# Patient Record
Sex: Female | Born: 1960 | Race: White | Hispanic: No | Smoking: Current every day smoker
Health system: Southern US, Community
[De-identification: ages and names within clinical notes are randomized; demographics above are authoritative.]

## PROBLEM LIST (undated history)

## (undated) DIAGNOSIS — I10 Essential (primary) hypertension: Secondary | ICD-10-CM

## (undated) DIAGNOSIS — M1990 Unspecified osteoarthritis, unspecified site: Secondary | ICD-10-CM

## (undated) DIAGNOSIS — G709 Myoneural disorder, unspecified: Secondary | ICD-10-CM

## (undated) DIAGNOSIS — T4145XA Adverse effect of unspecified anesthetic, initial encounter: Secondary | ICD-10-CM

## (undated) DIAGNOSIS — R0602 Shortness of breath: Secondary | ICD-10-CM

## (undated) DIAGNOSIS — Z8719 Personal history of other diseases of the digestive system: Secondary | ICD-10-CM

## (undated) DIAGNOSIS — K219 Gastro-esophageal reflux disease without esophagitis: Secondary | ICD-10-CM

## (undated) DIAGNOSIS — T8859XA Other complications of anesthesia, initial encounter: Secondary | ICD-10-CM

## (undated) DIAGNOSIS — I499 Cardiac arrhythmia, unspecified: Secondary | ICD-10-CM

## (undated) DIAGNOSIS — Z87898 Personal history of other specified conditions: Secondary | ICD-10-CM

## (undated) HISTORY — PX: ECTOPIC PREGNANCY SURGERY: SHX613

## (undated) HISTORY — PX: HERNIA REPAIR: SHX51

## (undated) HISTORY — PX: FACIAL FRACTURE SURGERY: SHX1570

## (undated) HISTORY — PX: BACK SURGERY: SHX140

## (undated) HISTORY — PX: ABDOMINAL EXPLORATION SURGERY: SHX538

## (undated) HISTORY — PX: CARDIAC CATHETERIZATION: SHX172

---

## 2013-02-17 ENCOUNTER — Other Ambulatory Visit: Payer: Self-pay | Admitting: Neurological Surgery

## 2013-03-07 ENCOUNTER — Encounter (HOSPITAL_COMMUNITY): Payer: Self-pay | Admitting: Pharmacy Technician

## 2013-03-10 ENCOUNTER — Ambulatory Visit (HOSPITAL_COMMUNITY)
Admission: RE | Admit: 2013-03-10 | Discharge: 2013-03-10 | Disposition: A | Discharge status: Home / Self Care | Payer: BC Managed Care – PPO | Source: Ambulatory Visit | Attending: Neurological Surgery | Admitting: Neurological Surgery

## 2013-03-10 ENCOUNTER — Encounter (HOSPITAL_COMMUNITY): Payer: Self-pay

## 2013-03-10 ENCOUNTER — Encounter (HOSPITAL_COMMUNITY)
Admission: RE | Admit: 2013-03-10 | Discharge: 2013-03-10 | Disposition: A | Discharge status: Home / Self Care | Payer: BC Managed Care – PPO | Source: Ambulatory Visit | Attending: Neurological Surgery | Admitting: Neurological Surgery

## 2013-03-10 DIAGNOSIS — Z01812 Encounter for preprocedural laboratory examination: Secondary | ICD-10-CM | POA: Insufficient documentation

## 2013-03-10 DIAGNOSIS — Z01818 Encounter for other preprocedural examination: Secondary | ICD-10-CM | POA: Insufficient documentation

## 2013-03-10 DIAGNOSIS — Z01810 Encounter for preprocedural cardiovascular examination: Secondary | ICD-10-CM | POA: Insufficient documentation

## 2013-03-10 HISTORY — DX: Adverse effect of unspecified anesthetic, initial encounter: T41.45XA

## 2013-03-10 HISTORY — DX: Personal history of other specified conditions: Z87.898

## 2013-03-10 HISTORY — DX: Cardiac arrhythmia, unspecified: I49.9

## 2013-03-10 HISTORY — DX: Personal history of other diseases of the digestive system: Z87.19

## 2013-03-10 HISTORY — DX: Other complications of anesthesia, initial encounter: T88.59XA

## 2013-03-10 HISTORY — DX: Shortness of breath: R06.02

## 2013-03-10 HISTORY — DX: Myoneural disorder, unspecified: G70.9

## 2013-03-10 HISTORY — DX: Gastro-esophageal reflux disease without esophagitis: K21.9

## 2013-03-10 HISTORY — DX: Unspecified osteoarthritis, unspecified site: M19.90

## 2013-03-10 HISTORY — DX: Essential (primary) hypertension: I10

## 2013-03-10 LAB — CBC
HCT: 43.2 % (ref 36.0–46.0)
Hemoglobin: 14.8 g/dL (ref 12.0–15.0)
MCH: 32.5 pg (ref 26.0–34.0)
MCHC: 34.3 g/dL (ref 30.0–36.0)
RDW: 13.6 % (ref 11.5–15.5)

## 2013-03-10 LAB — BASIC METABOLIC PANEL
BUN: 11 mg/dL (ref 6–23)
Calcium: 9.6 mg/dL (ref 8.4–10.5)
Creatinine, Ser: 0.67 mg/dL (ref 0.50–1.10)
GFR calc Af Amer: >90 mL/min (ref >90)
GFR calc non Af Amer: >90 mL/min (ref >90)
Glucose, Bld: 120 mg/dL — ABNORMAL HIGH (ref 70–99)

## 2013-03-10 LAB — ABO/RH: ABO/RH(D): A POS

## 2013-03-10 NOTE — Pre-Procedure Instructions (Signed)
SHAILYN WEYANDT  03/10/2013   Your procedure is scheduled on:  Wednesday  03/19/13    Report to [facility] at 530 AM.  Call this number if you have problems the morning of surgery: [phone]   Remember:   Do not eat food or drink liquids after midnight.   Take these medicines the morning of surgery with A SIP OF WATER: XANAX, AMLODIPINE(NORVASC), METOPROLOL(LOPRESSOR)    Do not wear jewelry, make-up or nail polish.  Do not wear lotions, powders, or perfumes. You may wear deodorant.  Do not shave 48 hours prior to surgery. Men may shave face and neck.  Do not bring valuables to the hospital.  [facility] is not responsible                   for any belongings or valuables.  Contacts, dentures or bridgework may not be worn into surgery.  Leave suitcase in the car. After surgery it may be brought to your room.  For patients admitted to the hospital, checkout time is 11:00 AM the day of  discharge.   Patients discharged the day of surgery will not be allowed to drive  home.  Name and phone number of your driver:   Special Instructions: Shower using CHG 2 nights before surgery and the night before surgery.  If you shower the day of surgery use CHG.  Use special wash - you have one bottle of CHG for all showers.  You should use approximately 1/3 of the bottle for each shower.   Please read over the following fact sheets that you were given: Pain Booklet, Coughing and Deep Breathing, Blood Transfusion Information, MRSA Information and Surgical Site Infection Prevention

## 2013-03-10 NOTE — Progress Notes (Addendum)
Anesthesia Chart Review:  Patient is a 52 year old female scheduled for L4-5 decompression, PLIF on 03/18/13 by Dr. Danielle Dess.  History includes smoking, hypertension, dysrhythmia (not specified), chronic dyspnea on exertion, GERD, hiatal hernia, paresthesia involving the fingers/toes, arthritis, back surgery 2011, history of facial fracture surgery, hernia repair 2012. OSA screening score was 4.  PCP is Dr. Christena Flake.  Cardiologist is Dr. Henriette Combs with [facility] who cleared patient for this procedure.  She had a nuclear stress test on 11/26/12 that showed normal perfusion, global hypokinesis with EF of 38%, no transient ischemic dilatation noted.  Due to her chest pain symptoms and reduced EF she was referred to cardiology and seen by Dr. Henriette Combs who recommended a cardiac cath (see below).  Cardiac cath on 12/19/12 at [facility] showed mild non-obstructive CAD (mild disease LAD, mild luminal irregularities of DIAG1/2, LCx, OM1/2/3, right PDA, first right PLA, 30% mid RCA stenosis), elevated opening aorta pressure consistent with hypertensive heart disease, mildly elevated LV filling pressures with LVEDP of 16 mm Hg, no gradient across the AV, hyperdynamic LV systolic function with LVEF of 76%, no regional wall motion abnormalities, no MR.  Notes indicates that she had an echo in 11/2012, but records are still pending.  EKG on 03/10/13 showed SB at 38 bpm, anterior ST/T wave abnormality, consider anterior ischemia. Her T wave abnormality appears similar to her EKG from 12/19/12.  (Her HR was 43 bpm at PAT and 47 bpm on 12/19/12 at [facility]).  She is on metoprolol 50 mg daily.  I was not asked to evaluate patient during her PAT visit.  Her cardiology office is now closed, but will contact their office tomorrow regarding bradycardia in case any medication adjustments are felt warranted prior to surgery.      CXR report on 03/10/13 showed: Heart size is normal.  Emphysematous changes are present. Mild bibasilar opacities likely represents scarring or atelectasis. No significant airspace consolidation is present. S-shaped scoliosis of the lumbar spine is convex to the right at T7-8 into the left at L1.  Preoperative labs noted.    I will follow-up with [facility] regarding EKG and echocardiogram tomorrow.  Velna Ochs [facility]/Anesthesiology Phone [phone] 03/10/2013 5:06 PM  Addendum: 03/11/13 3:00 PM I called and spoke with patient.  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  She has been on metoprolol 50 mg Q AM (instead of BID) because of bradycardia. She gets occasional feeling of light headedness that only lasts a few seconds, but otherwise does not feel very symptomatic.  I reviewed with Anesthesiologist Dr. Chaney Malling.  Patient is a first case, so we will have her hold metoprolol on the morning of surgery--at least until we are able to get vitals.  She can be given metoprolol PO or IV at that time if there is no significant bradycardia.  Patient is aware.  I also spoke with Becky at Dr. Magnus Ivan office about patient's bradycardia and will fax a copy of her EKG from 03/10/13.  She will have one the the cardiologists review, and their office will let the patient know if they have any additional recommendations. Echo report is still pending.  Addendum: 03/12/13 12:15 PM I received a phone call from nurse Lawanda Cousins at [facility].  She reviewed 03/10/13 EKG with covering physician Dr. Iva Lento.  He would like patient to stop her metoprolol for now.  They will also see if she can  go by their [place] office for a repeat EKG on 03/17/13.  Dr. Eliberto Ivory will be back in the office by then, and she will have him review. Becky plans to fax a copy of the EKG to the Short Stay PAT fax [phone]).  I asked to to let Elsner's office know if there are any changes in Dr. Magnus Ivan recommendations.  She will update  the patient.  I also updated Jessica at Dr. Verlee Rossetti office. [facility] reports they do not have an echo report to fax.

## 2013-03-10 NOTE — Progress Notes (Signed)
03/10/13 1046  OBSTRUCTIVE SLEEP APNEA  Have you ever been diagnosed with sleep apnea through a sleep study? No  Do you snore loudly (loud enough to be heard through closed doors)?  1  Do you often feel tired, fatigued, or sleepy during the daytime? 1  Has anyone observed you stop breathing during your sleep? 0  Do you have, or are you being treated for high blood pressure? 1  BMI more than 35 kg/m2? 0  Age over 53 years old? 1  Neck circumference greater than 40 cm/18 inches? 0  Gender: 0  Obstructive Sleep Apnea Score 4  Score 4 or greater  Results sent to PCP

## 2013-03-11 ENCOUNTER — Encounter (HOSPITAL_COMMUNITY): Payer: Self-pay

## 2013-03-17 MED ORDER — CEFAZOLIN SODIUM-DEXTROSE 2-3 GM-% IV SOLR
2.0000 g | INTRAVENOUS | Status: AC
  Administered 2013-03-18: 2 g via INTRAVENOUS
  Filled 2013-03-17: qty 50

## 2013-03-18 ENCOUNTER — Encounter (HOSPITAL_COMMUNITY): Payer: Self-pay | Admitting: Vascular Surgery

## 2013-03-18 ENCOUNTER — Inpatient Hospital Stay (HOSPITAL_COMMUNITY)
Admission: RE | Admit: 2013-03-18 | Discharge: 2013-03-21 | DRG: 756 | Disposition: A | Discharge status: Home / Self Care | Payer: BC Managed Care – PPO | Source: Ambulatory Visit | Attending: Neurological Surgery | Admitting: Neurological Surgery

## 2013-03-18 ENCOUNTER — Encounter (HOSPITAL_COMMUNITY): Payer: Self-pay | Admitting: *Deleted

## 2013-03-18 ENCOUNTER — Inpatient Hospital Stay (HOSPITAL_COMMUNITY): Payer: BC Managed Care – PPO

## 2013-03-18 ENCOUNTER — Inpatient Hospital Stay (HOSPITAL_COMMUNITY): Payer: BC Managed Care – PPO | Admitting: Anesthesiology

## 2013-03-18 ENCOUNTER — Encounter (HOSPITAL_COMMUNITY)
Admission: RE | Disposition: A | Discharge status: Home / Self Care | Payer: Self-pay | Source: Ambulatory Visit | Attending: Neurological Surgery

## 2013-03-18 DIAGNOSIS — M4316 Spondylolisthesis, lumbar region: Secondary | ICD-10-CM

## 2013-03-18 DIAGNOSIS — F17200 Nicotine dependence, unspecified, uncomplicated: Secondary | ICD-10-CM | POA: Diagnosis present

## 2013-03-18 DIAGNOSIS — Q675 Congenital deformity of spine: Secondary | ICD-10-CM

## 2013-03-18 DIAGNOSIS — I10 Essential (primary) hypertension: Secondary | ICD-10-CM | POA: Diagnosis present

## 2013-03-18 DIAGNOSIS — K219 Gastro-esophageal reflux disease without esophagitis: Secondary | ICD-10-CM | POA: Diagnosis present

## 2013-03-18 DIAGNOSIS — M48061 Spinal stenosis, lumbar region without neurogenic claudication: Principal | ICD-10-CM | POA: Diagnosis present

## 2013-03-18 DIAGNOSIS — M4180 Other forms of scoliosis, site unspecified: Secondary | ICD-10-CM | POA: Diagnosis present

## 2013-03-18 SURGERY — POSTERIOR LUMBAR FUSION 1 LEVEL
Anesthesia: General | Site: Back | Wound class: Clean

## 2013-03-18 MED ORDER — FENTANYL CITRATE 0.05 MG/ML IJ SOLN
INTRAMUSCULAR | Status: DC | PRN
  Administered 2013-03-18: 100 ug via INTRAVENOUS
  Administered 2013-03-18: 50 ug via INTRAVENOUS
  Administered 2013-03-18: 100 ug via INTRAVENOUS
  Administered 2013-03-18: 50 ug via INTRAVENOUS

## 2013-03-18 MED ORDER — SODIUM CHLORIDE 0.9 % IV SOLN
INTRAVENOUS | Status: DC
  Administered 2013-03-18 – 2013-03-19 (×2): via INTRAVENOUS

## 2013-03-18 MED ORDER — OXYCODONE HCL 5 MG PO TABS
5.0000 mg | ORAL_TABLET | Freq: Once | ORAL | Status: AC | PRN
  Administered 2013-03-18: 5 mg via ORAL

## 2013-03-18 MED ORDER — SODIUM CHLORIDE 0.9 % IJ SOLN
3.0000 mL | Freq: Two times a day (BID) | INTRAMUSCULAR | Status: DC
  Administered 2013-03-20: 3 mL via INTRAVENOUS

## 2013-03-18 MED ORDER — ARTIFICIAL TEARS OP OINT
TOPICAL_OINTMENT | OPHTHALMIC | Status: DC | PRN
  Administered 2013-03-18: 1 via OPHTHALMIC

## 2013-03-18 MED ORDER — BUPIVACAINE HCL (PF) 0.5 % IJ SOLN
INTRAMUSCULAR | Status: DC | PRN
  Administered 2013-03-18: 10 mL

## 2013-03-18 MED ORDER — POLYETHYLENE GLYCOL 3350 17 G PO PACK
17.0000 g | PACK | Freq: Every day | ORAL | Status: DC | PRN
  Filled 2013-03-18: qty 1

## 2013-03-18 MED ORDER — 0.9 % SODIUM CHLORIDE (POUR BTL) OPTIME
TOPICAL | Status: DC | PRN
  Administered 2013-03-18: 1000 mL

## 2013-03-18 MED ORDER — EPHEDRINE SULFATE 50 MG/ML IJ SOLN
INTRAMUSCULAR | Status: DC | PRN
  Administered 2013-03-18: 10 mg via INTRAVENOUS

## 2013-03-18 MED ORDER — SODIUM CHLORIDE 0.9 % IR SOLN
Status: DC | PRN
  Administered 2013-03-18: 11:00:00

## 2013-03-18 MED ORDER — PHENYLEPHRINE HCL 10 MG/ML IJ SOLN
INTRAMUSCULAR | Status: DC | PRN
  Administered 2013-03-18 (×2): 80 ug via INTRAVENOUS

## 2013-03-18 MED ORDER — FLEET ENEMA 7-19 GM/118ML RE ENEM: 1.0000 | ENEMA | Freq: Once | RECTAL | Status: AC | PRN

## 2013-03-18 MED ORDER — ALUM & MAG HYDROXIDE-SIMETH 200-200-20 MG/5ML PO SUSP: 30.0000 mL | Freq: Four times a day (QID) | ORAL | Status: DC | PRN

## 2013-03-18 MED ORDER — SODIUM CHLORIDE 0.9 % IV SOLN
INTRAVENOUS | Status: AC
  Filled 2013-03-18: qty 500

## 2013-03-18 MED ORDER — MENTHOL 3 MG MT LOZG: 1.0000 | LOZENGE | OROMUCOSAL | Status: DC | PRN

## 2013-03-18 MED ORDER — LIDOCAINE HCL (CARDIAC) 20 MG/ML IV SOLN
INTRAVENOUS | Status: DC | PRN
  Administered 2013-03-18: 100 mg via INTRAVENOUS

## 2013-03-18 MED ORDER — ACETAMINOPHEN 325 MG PO TABS
650.0000 mg | ORAL_TABLET | ORAL | Status: DC | PRN
  Administered 2013-03-19 – 2013-03-20 (×4): 650 mg via ORAL
  Filled 2013-03-18 (×4): qty 2

## 2013-03-18 MED ORDER — MORPHINE SULFATE 2 MG/ML IJ SOLN
1.0000 mg | INTRAMUSCULAR | Status: DC | PRN
  Administered 2013-03-18 – 2013-03-20 (×10): 2 mg via INTRAVENOUS
  Filled 2013-03-18 (×11): qty 1

## 2013-03-18 MED ORDER — LACTATED RINGERS IV SOLN
INTRAVENOUS | Status: DC | PRN
  Administered 2013-03-18 (×2): via INTRAVENOUS

## 2013-03-18 MED ORDER — OXYCODONE HCL 5 MG PO TABS
ORAL_TABLET | ORAL | Status: AC
  Filled 2013-03-18: qty 1

## 2013-03-18 MED ORDER — METOPROLOL TARTRATE 50 MG PO TABS
50.0000 mg | ORAL_TABLET | Freq: Every morning | ORAL | Status: DC
  Administered 2013-03-21: 50 mg via ORAL
  Filled 2013-03-18 (×3): qty 1

## 2013-03-18 MED ORDER — ONDANSETRON HCL 4 MG/2ML IJ SOLN
4.0000 mg | INTRAMUSCULAR | Status: DC | PRN
  Administered 2013-03-18 – 2013-03-19 (×2): 4 mg via INTRAVENOUS
  Filled 2013-03-18 (×2): qty 2

## 2013-03-18 MED ORDER — AMLODIPINE BESYLATE 5 MG PO TABS
5.0000 mg | ORAL_TABLET | Freq: Every day | ORAL | Status: DC
  Administered 2013-03-19 – 2013-03-21 (×3): 5 mg via ORAL
  Filled 2013-03-18 (×3): qty 1

## 2013-03-18 MED ORDER — OXYCODONE HCL 5 MG/5ML PO SOLN: 5.0000 mg | Freq: Once | ORAL | Status: AC | PRN

## 2013-03-18 MED ORDER — SODIUM CHLORIDE 0.9 % IV SOLN: 250.0000 mL | INTRAVENOUS | Status: DC

## 2013-03-18 MED ORDER — BISACODYL 10 MG RE SUPP: 10.0000 mg | Freq: Every day | RECTAL | Status: DC | PRN

## 2013-03-18 MED ORDER — HYDROMORPHONE HCL PF 1 MG/ML IJ SOLN
INTRAMUSCULAR | Status: AC
  Filled 2013-03-18: qty 1

## 2013-03-18 MED ORDER — ONDANSETRON HCL 4 MG/2ML IJ SOLN
INTRAMUSCULAR | Status: DC | PRN
  Administered 2013-03-18: 4 mg via INTRAVENOUS

## 2013-03-18 MED ORDER — METHOCARBAMOL 100 MG/ML IJ SOLN
500.0000 mg | Freq: Four times a day (QID) | INTRAVENOUS | Status: DC | PRN
  Filled 2013-03-18 (×2): qty 5

## 2013-03-18 MED ORDER — BACITRACIN 50000 UNITS IM SOLR
INTRAMUSCULAR | Status: AC
  Filled 2013-03-18: qty 1

## 2013-03-18 MED ORDER — MIDAZOLAM HCL 5 MG/5ML IJ SOLN
INTRAMUSCULAR | Status: DC | PRN
  Administered 2013-03-18: 2 mg via INTRAVENOUS

## 2013-03-18 MED ORDER — LISINOPRIL 40 MG PO TABS
40.0000 mg | ORAL_TABLET | Freq: Every day | ORAL | Status: DC
  Administered 2013-03-19 – 2013-03-21 (×3): 40 mg via ORAL
  Filled 2013-03-18 (×3): qty 1

## 2013-03-18 MED ORDER — SENNA 8.6 MG PO TABS
1.0000 | ORAL_TABLET | Freq: Two times a day (BID) | ORAL | Status: DC
  Administered 2013-03-18 – 2013-03-20 (×5): 8.6 mg via ORAL
  Filled 2013-03-18 (×9): qty 1

## 2013-03-18 MED ORDER — ALPRAZOLAM 0.25 MG PO TABS
0.2500 mg | ORAL_TABLET | Freq: Three times a day (TID) | ORAL | Status: DC | PRN
  Administered 2013-03-20 (×2): 0.25 mg via ORAL
  Filled 2013-03-18 (×2): qty 1

## 2013-03-18 MED ORDER — ACETAMINOPHEN 650 MG RE SUPP: 650.0000 mg | RECTAL | Status: DC | PRN

## 2013-03-18 MED ORDER — METHOCARBAMOL 500 MG PO TABS
500.0000 mg | ORAL_TABLET | Freq: Four times a day (QID) | ORAL | Status: DC | PRN
  Administered 2013-03-19: 500 mg via ORAL
  Filled 2013-03-18 (×2): qty 1

## 2013-03-18 MED ORDER — ROCURONIUM BROMIDE 100 MG/10ML IV SOLN
INTRAVENOUS | Status: DC | PRN
  Administered 2013-03-18: 50 mg via INTRAVENOUS

## 2013-03-18 MED ORDER — SODIUM CHLORIDE 0.9 % IV SOLN
10.0000 mg | INTRAVENOUS | Status: DC | PRN
  Administered 2013-03-18: 15 ug/min via INTRAVENOUS

## 2013-03-18 MED ORDER — HEMOSTATIC AGENTS (NO CHARGE) OPTIME
TOPICAL | Status: DC | PRN
  Administered 2013-03-18: 1 via TOPICAL

## 2013-03-18 MED ORDER — NEOSTIGMINE METHYLSULFATE 1 MG/ML IJ SOLN
INTRAMUSCULAR | Status: DC | PRN
  Administered 2013-03-18: 2 mg via INTRAVENOUS

## 2013-03-18 MED ORDER — DOCUSATE SODIUM 100 MG PO CAPS
100.0000 mg | ORAL_CAPSULE | Freq: Two times a day (BID) | ORAL | Status: DC
  Administered 2013-03-18 – 2013-03-20 (×5): 100 mg via ORAL
  Filled 2013-03-18 (×4): qty 1

## 2013-03-18 MED ORDER — PROPOFOL 10 MG/ML IV BOLUS
INTRAVENOUS | Status: DC | PRN
  Administered 2013-03-18: 200 mg via INTRAVENOUS

## 2013-03-18 MED ORDER — OXYCODONE-ACETAMINOPHEN 5-325 MG PO TABS
1.0000 | ORAL_TABLET | ORAL | Status: DC | PRN
  Administered 2013-03-18 – 2013-03-21 (×4): 2 via ORAL
  Filled 2013-03-18 (×4): qty 2

## 2013-03-18 MED ORDER — HYDROMORPHONE HCL PF 1 MG/ML IJ SOLN
0.2500 mg | INTRAMUSCULAR | Status: DC | PRN
  Administered 2013-03-18 (×2): 0.5 mg via INTRAVENOUS

## 2013-03-18 MED ORDER — CEFAZOLIN SODIUM 1-5 GM-% IV SOLN
1.0000 g | Freq: Three times a day (TID) | INTRAVENOUS | Status: AC
  Administered 2013-03-18 – 2013-03-19 (×2): 1 g via INTRAVENOUS
  Filled 2013-03-18 (×2): qty 50

## 2013-03-18 MED ORDER — SODIUM CHLORIDE 0.9 % IJ SOLN: 3.0000 mL | INTRAMUSCULAR | Status: DC | PRN

## 2013-03-18 MED ORDER — THROMBIN 20000 UNITS EX SOLR
CUTANEOUS | Status: DC | PRN
  Administered 2013-03-18: 11:00:00 via TOPICAL

## 2013-03-18 MED ORDER — ONDANSETRON HCL 4 MG/2ML IJ SOLN: 4.0000 mg | Freq: Four times a day (QID) | INTRAMUSCULAR | Status: DC | PRN

## 2013-03-18 MED ORDER — LIDOCAINE-EPINEPHRINE 1 %-1:100000 IJ SOLN
INTRAMUSCULAR | Status: DC | PRN
  Administered 2013-03-18: 10 mL

## 2013-03-18 MED ORDER — GLYCOPYRROLATE 0.2 MG/ML IJ SOLN
INTRAMUSCULAR | Status: DC | PRN
  Administered 2013-03-18: 0.4 mg via INTRAVENOUS

## 2013-03-18 MED ORDER — PHENOL 1.4 % MT LIQD: 1.0000 | OROMUCOSAL | Status: DC | PRN

## 2013-03-18 SURGICAL SUPPLY — 63 items
BAG DECANTER FOR FLEXI CONT (MISCELLANEOUS) ×2 IMPLANT
BLADE SURG ROTATE 9660 (MISCELLANEOUS) IMPLANT
BUR MATCHSTICK NEURO 3.0 LAGG (BURR) ×2 IMPLANT
CAGE 11MM (Cage) ×2 IMPLANT
CANISTER SUCTION 2500CC (MISCELLANEOUS) ×2 IMPLANT
CLOTH BEACON ORANGE TIMEOUT ST (SAFETY) ×2 IMPLANT
CONT SPEC 4OZ CLIKSEAL STRL BL (MISCELLANEOUS) ×4 IMPLANT
COVER BACK TABLE 24X17X13 BIG (DRAPES) IMPLANT
COVER TABLE BACK 60X90 (DRAPES) ×2 IMPLANT
DECANTER SPIKE VIAL GLASS SM (MISCELLANEOUS) ×2 IMPLANT
DERMABOND ADVANCED (GAUZE/BANDAGES/DRESSINGS) ×1
DERMABOND ADVANCED .7 DNX12 (GAUZE/BANDAGES/DRESSINGS) ×1 IMPLANT
DRAPE C-ARM 42X72 X-RAY (DRAPES) ×4 IMPLANT
DRAPE LAPAROTOMY 100X72X124 (DRAPES) ×2 IMPLANT
DRAPE POUCH INSTRU U-SHP 10X18 (DRAPES) ×2 IMPLANT
DRAPE PROXIMA HALF (DRAPES) ×2 IMPLANT
DRSG OPSITE POSTOP 4X10 (GAUZE/BANDAGES/DRESSINGS) ×2 IMPLANT
DURAPREP 26ML APPLICATOR (WOUND CARE) ×2 IMPLANT
ELECT REM PT RETURN 9FT ADLT (ELECTROSURGICAL) ×2
ELECTRODE REM PT RTRN 9FT ADLT (ELECTROSURGICAL) ×1 IMPLANT
GAUZE SPONGE 4X4 16PLY XRAY LF (GAUZE/BANDAGES/DRESSINGS) IMPLANT
GLOVE BIOGEL M 8.0 STRL (GLOVE) ×2 IMPLANT
GLOVE BIOGEL PI IND STRL 8 (GLOVE) ×3 IMPLANT
GLOVE BIOGEL PI IND STRL 8.5 (GLOVE) ×2 IMPLANT
GLOVE BIOGEL PI INDICATOR 8 (GLOVE) ×3
GLOVE BIOGEL PI INDICATOR 8.5 (GLOVE) ×2
GLOVE ECLIPSE 7.5 STRL STRAW (GLOVE) ×6 IMPLANT
GLOVE ECLIPSE 8.5 STRL (GLOVE) ×4 IMPLANT
GLOVE EXAM NITRILE LRG STRL (GLOVE) IMPLANT
GLOVE EXAM NITRILE MD LF STRL (GLOVE) IMPLANT
GLOVE EXAM NITRILE XL STR (GLOVE) IMPLANT
GLOVE EXAM NITRILE XS STR PU (GLOVE) IMPLANT
GLOVE INDICATOR 8.0 STRL GRN (GLOVE) ×6 IMPLANT
GOWN BRE IMP SLV AUR LG STRL (GOWN DISPOSABLE) ×2 IMPLANT
GOWN BRE IMP SLV AUR XL STRL (GOWN DISPOSABLE) IMPLANT
GOWN STRL REIN 2XL LVL4 (GOWN DISPOSABLE) ×8 IMPLANT
KIT BASIN OR (CUSTOM PROCEDURE TRAY) ×2 IMPLANT
KIT ROOM TURNOVER OR (KITS) ×2 IMPLANT
MILL MEDIUM DISP (BLADE) ×2 IMPLANT
NEEDLE HYPO 22GX1.5 SAFETY (NEEDLE) ×2 IMPLANT
NS IRRIG 1000ML POUR BTL (IV SOLUTION) ×2 IMPLANT
PACK FOAM VITOSS 10CC (Orthopedic Implant) ×2 IMPLANT
PACK LAMINECTOMY NEURO (CUSTOM PROCEDURE TRAY) ×2 IMPLANT
PAD ARMBOARD 7.5X6 YLW CONV (MISCELLANEOUS) ×6 IMPLANT
PATTIES SURGICAL .5 X1 (DISPOSABLE) ×2 IMPLANT
ROD 35MM (Rod) ×4 IMPLANT
SCREW 40MM (Screw) ×8 IMPLANT
SCREW SET SPINAL STD HEXALOBE (Screw) ×8 IMPLANT
SPONGE GAUZE 4X4 12PLY (GAUZE/BANDAGES/DRESSINGS) ×2 IMPLANT
SPONGE LAP 4X18 X RAY DECT (DISPOSABLE) IMPLANT
SPONGE SURGIFOAM ABS GEL 100 (HEMOSTASIS) ×2 IMPLANT
SUT PROLENE 6 0 BV (SUTURE) ×2 IMPLANT
SUT VIC AB 1 CT1 18XBRD ANBCTR (SUTURE) ×2 IMPLANT
SUT VIC AB 1 CT1 8-18 (SUTURE) ×2
SUT VIC AB 2-0 CP2 18 (SUTURE) ×4 IMPLANT
SUT VIC AB 3-0 SH 8-18 (SUTURE) ×2 IMPLANT
SYR 20ML ECCENTRIC (SYRINGE) ×2 IMPLANT
SYR 3ML LL SCALE MARK (SYRINGE) ×8 IMPLANT
TOWEL OR 17X24 6PK STRL BLUE (TOWEL DISPOSABLE) ×2 IMPLANT
TOWEL OR 17X26 10 PK STRL BLUE (TOWEL DISPOSABLE) ×2 IMPLANT
TRAP SPECIMEN MUCOUS 40CC (MISCELLANEOUS) ×2 IMPLANT
TRAY FOLEY CATH 14FRSI W/METER (CATHETERS) ×2 IMPLANT
WATER STERILE IRR 1000ML POUR (IV SOLUTION) ×2 IMPLANT

## 2013-03-18 NOTE — Anesthesia Preprocedure Evaluation (Addendum)
Anesthesia Evaluation  Patient identified by MRN, date of birth, ID band Patient awake    Reviewed: Allergy & Precautions, H&P , NPO status , Patient's Chart, lab work & pertinent test results, reviewed documented beta blocker date and time   Airway Mallampati: I TM Distance: >3 FB Neck ROM: full    Dental  (+) Edentulous Upper, Upper Dentures and Dental Advidsory Given   Pulmonary shortness of breath and with exertion, Current Smoker,          Cardiovascular hypertension, On Home Beta Blockers - dysrhythmias     Neuro/Psych Anxiety  Neuromuscular disease    GI/Hepatic GERD-  Controlled and Medicated,  Endo/Other    Renal/GU      Musculoskeletal   Abdominal   Peds  Hematology   Anesthesia Other Findings   Reproductive/Obstetrics                          Anesthesia Physical Anesthesia Plan  ASA: II  Anesthesia Plan: General   Post-op Pain Management:    Induction: Intravenous  Airway Management Planned: Oral ETT  Additional Equipment:   Intra-op Plan:   Post-operative Plan: Extubation in OR  Informed Consent: I have reviewed the patients History and Physical, chart, labs and discussed the procedure including the risks, benefits and alternatives for the proposed anesthesia with the patient or authorized representative who has indicated his/her understanding and acceptance.   Dental Advisory Given  Plan Discussed with: Anesthesiologist, CRNA and Surgeon  Anesthesia Plan Comments:        Anesthesia Quick Evaluation

## 2013-03-18 NOTE — Anesthesia Procedure Notes (Signed)
Procedure Name: Intubation Date/Time: 03/18/2013 10:37 AM Performed by: Carmela Rima Pre-anesthesia Checklist: Patient identified, Timeout performed, Emergency Drugs available, Suction available and Patient being monitored Patient Re-evaluated:Patient Re-evaluated prior to inductionOxygen Delivery Method: Circle system utilized Preoxygenation: Pre-oxygenation with 100% oxygen Intubation Type: IV induction Ventilation: Mask ventilation without difficulty Laryngoscope Size: Mac and 3 Grade View: Grade II Tube type: Oral Tube size: 7.5 mm Number of attempts: 1 Placement Confirmation: ETT inserted through vocal cords under direct vision,  positive ETCO2 and breath sounds checked- equal and bilateral Secured at: 21 cm Tube secured with: Tape Dental Injury: Teeth and Oropharynx as per pre-operative assessment

## 2013-03-18 NOTE — Preoperative (Signed)
Beta Blockers   Reason not to administer Beta Blockers:Not Applicable 

## 2013-03-18 NOTE — Progress Notes (Signed)
   CARE MANAGEMENT NOTE 03/18/2013  Patient:  Belinda Vazquez, Belinda Vazquez   Account Number:  [id number]  Date Initiated:  03/18/2013  Documentation initiated by:  Jiles Crocker  Subjective/Objective Assessment:   ADMITTED FOR SURGERY - L4-L5 decompressive laminectomy decompression of L4 and L5     Action/Plan:   LIVES WITH FAMILY MEMBERS; CM FOLLOWING FOR DCP   Anticipated DC Date:  03/25/2013   Anticipated DC Plan:  HOME/SELF CARE/ VS HHC      DC Planning Services  CM consult               Status of service:  In process, will continue to follow Medicare Important Message given?  NA - LOS <3 / Initial given by admissions (If response is "NO", the following Medicare IM given date fields will be blank)  Per UR Regulation:  Reviewed for med. necessity/level of care/duration of stay  If discussed at Long Length of Stay Meetings, dates discussed:    Comments:  03/18/2013- B Yuliya Nova RN,BSN,MHA

## 2013-03-18 NOTE — Transfer of Care (Signed)
Immediate Anesthesia Transfer of Care Note  Patient: Belinda Vazquez  Procedure(s) Performed: Procedure(s) with comments: POSTERIOR LUMBAR FUSION 1 LEVEL (N/A) - Lumbar four-five Decompression/posterior lumbar interbody fusion with peek spacers/pedicle screws  Patient Location: PACU  Anesthesia Type:General  Level of Consciousness: awake, alert  and oriented  Airway & Oxygen Therapy: Patient Spontanous Breathing and Patient connected to nasal cannula oxygen  Post-op Assessment: Report given to PACU RN, Post -op Vital signs reviewed and stable and Patient moving all extremities X 4  Post vital signs: Reviewed and stable  Complications: No apparent anesthesia complications

## 2013-03-18 NOTE — Anesthesia Postprocedure Evaluation (Signed)
Anesthesia Post Note  Patient: Belinda Vazquez  Procedure(s) Performed: Procedure(s) (LRB): POSTERIOR LUMBAR FUSION 1 LEVEL (N/A)  Anesthesia type: General  Patient location: PACU  Post pain: Pain level controlled and Adequate analgesia  Post assessment: Post-op Vital signs reviewed, Patient's Cardiovascular Status Stable, Respiratory Function Stable, Patent Airway and Pain level controlled  Last Vitals:  Filed Vitals:   03/18/13 1415  BP: 169/94  Pulse: 105  Temp:   Resp: 17    Post vital signs: Reviewed and stable  Level of consciousness: awake, alert  and oriented  Complications: No apparent anesthesia complications

## 2013-03-18 NOTE — Progress Notes (Signed)
Patient ID: Belinda Vazquez, female   DOB: 1961-03-28, 52 y.o.   MRN: [id number] Postop check.  Patient doing reasonably well with moderate back pain. Lower extremity strength intact.  Patient needs to ambulate. Nursing to ambulate patient

## 2013-03-18 NOTE — Op Note (Signed)
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  Asst.: Hilda Lias, M.D..  Indications: Patient is Belinda Vazquez is a 52 y.o. female who who's had significant back pain and lumbar radiculopathy for over a years period time. A lumbar MRI demonstrates advanced scoliosis with high-grade foraminal stenosis. she was advised regarding surgical intervention.  Procedure: The patient was brought to the operating room supine on a stretcher. After the smooth induction of general endotracheal anesthesia she was turned prone and the back was prepped with alcohol and DuraPrep. The back was then draped sterilely. An elliptical incision was made around the previous scar and the scar was excised. The dissection was carried down to the lumbar dorsal fascia. A localizing radiograph identified the L4 and L5 spinous processes. A subligamentous dissection was created at L4 and L5 to expose the interlaminar space at L4 and L5 and the facet joints over the L4-L5 interspace. The left side was severely collapsed with a markedly dysmorphic facet joint causing a significant scoliosis. Laminotomies were were then created removing the entire inferior margin of the lamina of L4 including the inferior facet at the L4-L5 joint. The yellow ligament was taken up and the common dural tube was exposed along with the L4 nerve root superiorly, and the L5 nerve root inferiorly, the disc space was exposed and epidural veins in this region  were cauterized and divided. The L4 nerve roots and the L5 nerve root were dissected with care taken to protect them. There was a severe stenosis particularly for the L4 nerve root on the left side. During the dissection an inadvertent durotomy occurred on the left side. This was closed with a singular 6-0 Prolene suture. The disc space was opened and a combination of curettes and rongeurs was used to evacuate the disc space fully. The endplates were removed using sharp curettes. An interbody spacer was placed to distract the disc space while the contralateral discectomy was performed. When the entirety of the disc was removed and the endplates were prepared final sizing of the disc space was obtained 11 mm peek spacers were chosen and packed with autograft and allograft and placed into the interspace only on the left side. The remainder of the interspace was packed with autograft and allograft. Pedicle entry sites were then chosen using fluoroscopic guidance and 6.5 x 40 mm screws were placed in L4 and 6.5 x 40 mm screws were placed in L5. The lateral gutters were decorticated and graft was packed in the posterolateral gutters between L4 and L5. Final radiographs were obtained after placing appropriately sized rods between the pedicle screws at L4-L5 and torquing these to the appropriate tension. The surgical site was inspected carefully to assure the L4 and L5 nerve roots were well decompressed, hemostasis was obtained, and the graft was well packed. Then the retractors were removed and the wound was closed with #1 Vicryl in the lumbar dorsal fascia 2-0 Vicryl in the subcutaneous tissue and 3-0 Vicryl subcuticularly. 20 cc of half percent Marcaine was injected into the paraspinous musculature at the time of closure. Blood loss was estimated at 250 cc. The patient tolerated procedure  well and was returned to the recovery room in stable condition.

## 2013-03-18 NOTE — H&P (Signed)
Belinda Vazquez is an 52 y.o. female.   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  I discussed with her consideration of surgical decompression and stabilization and because her MRI was dated, I suggested that we obtain a new study.  At that time, she was also having some issues of a pericardial effusion and was having some cardiac workup.  Since that time, she has had a cardiac workup and she was told that she has no blockages in her vessels and her heart seems to be doing well.  She had her MRI back in March and she is seen now for further discussion of this problem.  She tells me that her back still bothers her on a chronic basis.  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  DATA:                                                  The MRI demonstrates and recapitulate the findings on her previous study, that is that she has severe spondylotic stenosis at L4-L5 secondary to degeneration of the facet joint at that level, this is particularly worse on the left side.  She does have the scoliosis above her spine, which appears stable, but the other discs do not appear to be involved and the nerve roots seem to exit freely.  PHYSICAL EXAMINATION:                    I note on her exam that she stands straight and erect; however, she does have some weakness in the tibialis anterior on the left side being graded at 4/5 compared to the right side.  There is some softness in the tibialis anterior musculature, but no gross atrophy of it.  Past Medical History  Diagnosis Date  . Hypertension   . Complication of anesthesia     HX PANIC ATTACK, TROUBLE O2 MASK   . H/O hiatal hernia   . GERD (gastroesophageal reflux disease)     OCC  . Shortness of breath     WITH EXERTION   . Arthritis   .  History of nocturia   . Neuromuscular disorder     NUMBNESS TINGLING TOES+ FINGERS  . Dysrhythmia     bradycardia, "irregular" heartbeat but not afib    Past Surgical History  Procedure Laterality Date  . Cardiac catheterization      11/2012  . Ectopic pregnancy surgery      1986  . Facial fracture surgery      LT EYE [id number]  . Abdominal exploration surgery      09/2009  [facility] TISSUE FOUND  . Back surgery      2011  . Hernia repair      2012    History reviewed. No pertinent family history. Social History:  reports that she has been smoking.  She does not have any smokeless tobacco history on file. She reports that she does not drink alcohol or use illicit drugs.  Allergies: No Known Allergies  Medications Prior to Admission  Medication Sig Dispense Refill  . ALPRAZolam Prudy Feeler)  0.25 MG tablet Take 0.25 mg by mouth 3 (three) times daily as needed for sleep or anxiety.      Marland Kitchen amLODipine (NORVASC) 5 MG tablet Take 5 mg by mouth daily.      Marland Kitchen lisinopril (PRINIVIL,ZESTRIL) 40 MG tablet Take 40 mg by mouth daily.      . metoprolol (LOPRESSOR) 50 MG tablet Take 50 mg by mouth every morning.        No results found for this or any previous visit (from the past 48 hour(s)). No results found.  Review of Systems  HENT: Negative.   Eyes: Negative.   Respiratory: Negative.   Cardiovascular: Negative.   Gastrointestinal: Negative.   Genitourinary: Negative.   Musculoskeletal: Positive for back pain.  Skin: Negative.   Neurological: Positive for tingling, sensory change, focal weakness and weakness.  Psychiatric/Behavioral: Negative.     Blood pressure 139/88, resp. rate 16, SpO2 99.00%. Physical Exam  Constitutional: She appears well-developed and well-nourished.  HENT:  Head: Normocephalic and atraumatic.  Eyes: Conjunctivae and EOM are normal. Pupils are equal, round, and reactive to light.  Neck: Normal range of motion. Neck supple.  Cardiovascular: Normal rate and  regular rhythm.   Respiratory: Effort normal and breath sounds normal.  GI: Soft. Bowel sounds are normal. There is no tenderness.  Musculoskeletal:  Mild degen scoliosis, tender in lumbar spine  Neurological:  Mild l5 weakness  Skin: Skin is warm and dry.  Psychiatric: She has a normal mood and affect. Her behavior is normal. Judgment and thought content normal.     Assessment/Plan I advised that the surgery I recommended previously would still be my plan of action for her.  A single level decompression and fusion at L4-L5 should render some relief to her L5 nerve roots and hopefully give her relief of the significant back pain and leg pain that she has been experiencing.  I discussed the technique of the surgery with her, her mother, and her sister, who accompanied her on the visit today.  I indicated that we would remove the entirety of the disc, place spacers into the vertebrae and then screws at L4 and L5 to hold the two vertebrae together.  Bone graft would be placed in the disc space and on the lateral aspects of the vertebrae in order to affect the fusion, that is so that she can heal the bone one to the other at L4 and L5 eliminating the joint.  The surgery itself takes about three hours to do.  She would be in the hospital between two and four days.  After surgery, patients are mobilized in a corset type brace in the lumbar spine, which she only has to wear at anytime that she is out of bed.  Most patients are weaned off the narcotic pain medications within about four weeks and then they can resume driving.  I would expect her time out of work to be between four to six months depending on however her recuperation goes.  Having discussed all these things, it became evident that Belinda Vazquez is quite ready to proceed with some surgical intervention as this process has been unrelenting.  She is admitted for surgery.  Porchia Sinkler J 03/18/2013, 6:59 AM

## 2013-03-19 ENCOUNTER — Encounter (HOSPITAL_COMMUNITY): Payer: Self-pay | Admitting: *Deleted

## 2013-03-19 LAB — URINALYSIS, ROUTINE W REFLEX MICROSCOPIC
Bilirubin Urine: NEGATIVE
Specific Gravity, Urine: 1.01 (ref 1.005–1.030)
Urobilinogen, UA: 0.2 mg/dL (ref 0.0–1.0)
pH: 6.5 (ref 5.0–8.0)

## 2013-03-19 LAB — URINE MICROSCOPIC-ADD ON

## 2013-03-19 MED ORDER — PNEUMOCOCCAL VAC POLYVALENT 25 MCG/0.5ML IJ INJ
0.5000 mL | INJECTION | INTRAMUSCULAR | Status: AC
  Filled 2013-03-19: qty 0.5

## 2013-03-19 MED ORDER — KETOROLAC TROMETHAMINE 15 MG/ML IJ SOLN
15.0000 mg | Freq: Three times a day (TID) | INTRAMUSCULAR | Status: AC
  Administered 2013-03-19 – 2013-03-20 (×5): 15 mg via INTRAVENOUS
  Filled 2013-03-19 (×5): qty 1

## 2013-03-19 NOTE — Evaluation (Signed)
Physical Therapy Evaluation Patient Details Name: Belinda Vazquez MRN: [id number] DOB: 26-Apr-1961 Today's Date: 03/19/2013 Time: 4098-1191 PT Time Calculation (min): 30 min  PT Assessment / Plan / Recommendation History of Present Illness  16 female s/p L4-5  laminectomy   Clinical Impression  Patient is s/p spinal surgery resulting in functional limitations due to the deficits listed below (see PT Problem List). Patient will benefit from skilled PT to increase their independence and safety with mobility to allow discharge home with support from family. Education provided to patient on mobility with regards to back precautions as well as strategies while standing at the sink to protect her back in static standing. Patient very shaky today especially as she fatigues. C/o significant pain and appeared very anxious during our session. Required calming and redirective cues.  Encouraged ambulation and mobility with nsg staff.      PT Assessment  Patient needs continued PT services    Follow Up Recommendations  Home health PT;Supervision for mobility/OOB    Does the patient have the potential to tolerate intense rehabilitation      Barriers to Discharge        Equipment Recommendations  None recommended by PT    Recommendations for Other Services     Frequency Min 5X/week    Precautions / Restrictions Precautions Precautions: Fall;Back Precaution Booklet Issued: No Precaution Comments: reviewed 3/3 back precautions Required Braces or Orthoses: Spinal Brace Spinal Brace: Lumbar corset;Applied in sitting position   Pertinent Vitals/Pain C/o 8/10 back pain especially during transitional movements needing increased assist, pt had been premedicated     Mobility  Bed Mobility Bed Mobility: Sit to Sidelying Right;Rolling Left Rolling Left: 4: Min assist Sit to Sidelying Right: 4: Min assist Details for Bed Mobility Assistance: sequencing cues for safe technique, assist to elevate legs  and control descent of trunk to bed, cues to relax especially during roll pt arching back in pain, anxious with pain, upper extremities tremulous, needs calming cues Transfers Transfers: Sit to Stand;Stand to Sit Sit to Stand: 3: Mod assist;With upper extremity assist Stand to Sit: 4: Min assist;With upper extremity assist Details for Transfer Assistance: facilitation for upward lift and follow through as she stabilized with RW, cues for hand placement and positioning in prep to stand Ambulation/Gait Ambulation/Gait Assistance: 4: Min guard Ambulation Distance (Feet): 150 Feet Assistive device: Rolling walker Ambulation/Gait Assistance Details: cues for posture and relaxed shoulder/neck Gait Pattern: Decreased stride length;Shuffle Gait velocity: decreased Stairs: No    Exercises     PT Diagnosis: Difficulty walking;Generalized weakness;Acute pain  PT Problem List: Decreased strength;Decreased activity tolerance;Decreased balance;Decreased mobility;Pain;Decreased knowledge of use of DME;Decreased knowledge of precautions PT Treatment Interventions: DME instruction;Gait training;Functional mobility training;Therapeutic activities;Patient/family education;Stair training     PT Goals(Current goals can be found in the care plan section) Acute Rehab PT Goals Patient Stated Goal: home, decreased pain PT Goal Formulation: With patient Time For Goal Achievement: 03/26/13 Potential to Achieve Goals: Good  Visit Information  Last PT Received On: 03/19/13 Assistance Needed: +1 History of Present Illness: 7 female s/p L4-5  laminectomy        Prior Functioning  Home Living Family/patient expects to be discharged to:: Private residence Living Arrangements: Alone Available Help at Discharge: Family Type of Home: House Home Access: Stairs to enter Secretary/administrator of Steps: 2-3 Entrance Stairs-Rails: Right Home Layout: One level Home Equipment: Walker - 2 wheels Prior  Function Level of Independence: Independent Communication Communication: No difficulties    Cognition  Cognition Arousal/Alertness: Awake/alert Behavior During Therapy: Anxious Overall Cognitive Status: Within Functional Limits for tasks assessed    Extremity/Trunk Assessment Upper Extremity Assessment Upper Extremity Assessment: Defer to OT evaluation Lower Extremity Assessment Lower Extremity Assessment: Generalized weakness   Balance Balance Balance Assessed: Yes Static Standing Balance Static Standing - Balance Support: During functional activity Static Standing - Level of Assistance: 5: Stand by assistance Static Standing - Comment/# of Minutes: alternating unilateral upper extremity assist for stability standing at the sink performing functional activity  End of Session PT - End of Session Equipment Utilized During Treatment: Gait belt Activity Tolerance: Patient tolerated treatment well;Patient limited by pain  GP     WHITLOW,Kaily Wragg HELEN 03/19/2013, 9:00 AM

## 2013-03-19 NOTE — Progress Notes (Signed)
Subjective: Patient reports Offers no complaints feels well overall. A little bit shaky today  Objective: Vital signs in last 24 hours: Temp:  [97.7 F (36.5 C)-100 F (37.8 C)] 100 F (37.8 C) (07/23 0605) Pulse Rate:  [64-117] 88 (07/23 0605) Resp:  [13-29] 18 (07/23 0605) BP: (118-170)/(65-96) 131/67 mmHg (07/23 0605) SpO2:  [95 %-100 %] 95 % (07/23 0605) Weight:  [51.71 kg (114 lb)] 51.71 kg (114 lb) (07/23 0100)  Intake/Output from previous day: 07/22 0701 - 07/23 0700 In: 1940 [P.O.:240; I.V.:1700] Out: 2400 [Urine:2200; Blood:200] Intake/Output this shift:    Dressing is clean and dry. Motor function is intact in lower extremities.  Lab Results: No results found for this basename: WBC, HGB, HCT, PLT,  in the last 72 hours BMET No results found for this basename: NA, K, CL, CO2, GLUCOSE, BUN, CREATININE, CALCIUM,  in the last 72 hours  Studies/Results: Dg Lumbar Spine 2-3 Views  03/18/2013   *RADIOLOGY REPORT*  Clinical Data: Lumbar fusion  LUMBAR SPINE - 2-3 VIEW  Comparison: MR 11/14/2012  Findings: Two intraoperative C-arm fluoroscopic images.  Posterior fusion hardware L4-5.  Graft markers in the interspace to the left of midline.  Normal alignment.  IMPRESSION: P L I F L4-5.   Original Report Authenticated By: D. Andria Rhein, MD   Dg Lumbar Spine 2-3 Views  03/18/2013   *RADIOLOGY REPORT*  Clinical Data: L4-5 laminectomy and fusion.  Localization films.  LUMBAR SPINE - 2-3 VIEW  Comparison: MR lumbar spine 11/14/2012.  Findings: We are provided two intraoperative views of the lumbar spine in the lateral projection.  Images demonstrate L4-5 localization.  IMPRESSION: As above.   Original Report Authenticated By: Holley Dexter, M.D.    Assessment/Plan: Stable postop  LOS: 1 day  Continuous ambulation and observation. Shakiness is bothersome   Belinda Vazquez J 03/19/2013, 8:36 AM

## 2013-03-19 NOTE — Evaluation (Signed)
Occupational Therapy Evaluation Patient Details Name: Belinda Vazquez MRN: [id number] DOB: 08-Jan-1961 Today's Date: 03/19/2013 Time: 0865-7846 OT Time Calculation (min): 17 min  OT Assessment / Plan / Recommendation History of present illness 34 female s/p L4-5  laminectomy    Clinical Impression   Pt is s/p L4-5 Laminectomy surgery resulting in the deficits listed below (see OT problem list). Patient will benefit from skilled OT to increase their independence and safety with mobility (while adhering to their precautions) to allow discharge home with HHOT.     OT Assessment  Patient needs continued OT Services    Follow Up Recommendations  Home health OT       Equipment Recommendations  Other (comment) (To be determined by progress)       Frequency  Min 2X/week    Precautions / Restrictions Precautions Precautions: Fall;Back Precaution Booklet Issued: Yes (comment) Precaution Comments: BAT handout Required Braces or Orthoses: Spinal Brace Spinal Brace: Lumbar corset;Applied in sitting position Restrictions Weight Bearing Restrictions: No   Pertinent Vitals/Pain Pt reported 8/10 pain, Pt repositioned in bed. RN notified.    ADL  Eating/Feeding: Modified independent Where Assessed - Eating/Feeding: Edge of bed Grooming: Minimal assistance Where Assessed - Grooming: Supported standing Upper Body Bathing: Minimal assistance Where Assessed - Upper Body Bathing: Supported sitting Lower Body Bathing: Maximal assistance Where Assessed - Lower Body Bathing: Supported sitting Upper Body Dressing: Minimal assistance Where Assessed - Upper Body Dressing: Supported sitting Lower Body Dressing: Maximal assistance Where Assessed - Lower Body Dressing: Supported sitting Toilet Transfer: Moderate assistance Toilet Transfer Method: Sit to stand Toilet Transfer Equipment: Comfort height toilet;Grab bars Toileting - Clothing Manipulation and Hygiene: Maximal assistance Where  Assessed - Glass blower/designer Manipulation and Hygiene: Standing Tub/Shower Transfer: Maximal assistance Tub/Shower Transfer Method: Stand pivot Equipment Used: Back brace;Gait belt;Rolling walker Transfers/Ambulation Related to ADLs: facilitation for upward lift and follow through as she stabilized with RW, cues for hand placement and positioning in prep to stand    OT Diagnosis: Acute pain;Generalized weakness  OT Problem List: Decreased strength;Decreased range of motion;Decreased activity tolerance;Decreased safety awareness;Decreased knowledge of use of DME or AE;Decreased knowledge of precautions;Pain OT Treatment Interventions: Self-care/ADL training;Therapeutic exercise;DME and/or AE instruction;Therapeutic activities;Patient/family education   OT Goals(Current goals can be found in the care plan section) Acute Rehab OT Goals Patient Stated Goal: home, decreased pain OT Goal Formulation: With patient Time For Goal Achievement: 04/02/13 Potential to Achieve Goals: Good ADL Goals Pt Will Perform Grooming: with modified independence;standing Pt Will Perform Lower Body Dressing: with min guard assist;with caregiver independent in assisting;sit to/from stand;with adaptive equipment Pt Will Perform Tub/Shower Transfer: with min guard assist;with caregiver independent in assisting;Stand pivot transfer Additional ADL Goal #1: Pt will recall and maintain 3/3 back precautions during ADL  Visit Information  Last OT Received On: 03/19/13 Assistance Needed: +1 PT/OT Co-Evaluation/Treatment: Yes History of Present Illness: 28 female s/p L4-5  laminectomy        Prior Functioning     Home Living Family/patient expects to be discharged to:: Private residence Living Arrangements: Alone Available Help at Discharge: Family Type of Home: House Home Access: Stairs to enter Secretary/administrator of Steps: 2-3 Entrance Stairs-Rails: Right Home Layout: One level Home Equipment: Walker - 2  wheels Additional Comments: Pt will have mom and 3 brothers on a schedule to help her when she is home Prior Function Level of Independence: Independent Communication Communication: No difficulties Dominant Hand: Right         Vision/Perception  Vision - History Baseline Vision: Wears glasses only for reading Patient Visual Report: No change from baseline Vision - Assessment Eye Alignment: Within Functional Limits Vision Assessment: Vision not tested   Cognition  Cognition Arousal/Alertness: Awake/alert Behavior During Therapy: Anxious Overall Cognitive Status: Within Functional Limits for tasks assessed    Extremity/Trunk Assessment Upper Extremity Assessment Upper Extremity Assessment: Overall WFL for tasks assessed Lower Extremity Assessment Lower Extremity Assessment: Overall WFL for tasks assessed Cervical / Trunk Assessment Cervical / Trunk Assessment: Normal     Mobility Bed Mobility Bed Mobility: Sit to Sidelying Right;Rolling Left Rolling Left: 4: Min assist Sit to Sidelying Right: 4: Min assist Details for Bed Mobility Assistance: sequencing cues for safe technique, assist to elevate legs and control descent of trunk to bed, cues to relax especially during roll pt arching back in pain, anxious with pain, needs calming cues Transfers Transfers: Sit to Stand;Stand to Sit Sit to Stand: 3: Mod assist;With upper extremity assist Stand to Sit: 4: Min assist;With upper extremity assist Details for Transfer Assistance: facilitation for upward lift and follow through as she stabilized with RW, cues for hand placement and positioning in prep to stand        Balance Balance Balance Assessed: Yes Static Standing Balance Static Standing - Balance Support: During functional activity Static Standing - Level of Assistance: 5: Stand by assistance Static Standing - Comment/# of Minutes: alternating unilateral upper extremity assist for stability standing at the sink  performing functional activity   End of Session OT - End of Session Equipment Utilized During Treatment: Gait belt;Rolling walker;Back brace Activity Tolerance: Patient limited by pain;Patient tolerated treatment well Patient left: in bed;with call bell/phone within reach;with bed alarm set Nurse Communication: Mobility status;Other (comment) (Pt asking about blood pressure medications)  GO     Sherryl Manges 03/19/2013, 9:29 AM

## 2013-03-19 NOTE — Progress Notes (Signed)
Pt.'s foley cath d/c'd this am; pt. Has been voiding through out the day but now c/o " feeling like I could pee more." Pt. Bladder scanned showing >771 cc urine. Pt. Also has temp of 103 oral. Dr. Danielle Dess called; new orders received. Foley cath placed with NT in room with the return of clear, yellow urine. UA sent and Tylenol given. Will monitor.

## 2013-03-19 NOTE — Progress Notes (Signed)
Pt. With continued c/o severe pain; some relief with Morphine. Refuses to take Percocet d/t it causing nausea and refuses muscle relaxant stating "They don't work for me." Dr. Danielle Dess notified, new orders received.

## 2013-03-19 NOTE — Clinical Social Work Note (Signed)
CSW reviewed chart.  PT recommends Home Health PT for supervision for mobility/OOB.  RNCM notified.  CSW signing off.  Please re-assess as necessary.  Vickii Penna, LCSWA [phone]  Clinical Social Work

## 2013-03-19 NOTE — Evaluation (Signed)
I agree with the following treatment note after reviewing documentation.   Johnston, Bristal Steffy Brynn   OTR/L Pager: [phone] Office: [phone] .   

## 2013-03-20 LAB — URINE CULTURE
Culture: NO GROWTH
Special Requests: NORMAL

## 2013-03-20 MED FILL — Heparin Sodium (Porcine) Inj 1000 Unit/ML: INTRAMUSCULAR | Qty: 30 | Status: AC

## 2013-03-20 MED FILL — Sodium Chloride IV Soln 0.9%: INTRAVENOUS | Qty: 1000 | Status: AC

## 2013-03-20 NOTE — Progress Notes (Signed)
Physical Therapy Treatment Patient Details Name: Belinda Vazquez MRN: [id number] DOB: 15-Jan-1961 Today's Date: 03/20/2013 Time: 1017-1040 PT Time Calculation (min): 23 min  PT Assessment / Plan / Recommendation  History of Present Illness 65 female s/p L4-5  laminectomy       PT Comments   Great progress today. Ambulating without RW. Likely will progress past HH therapies. Need to make sure she can get in and out of the bed OK at home.   Follow Up Recommendations  Home health PT;Supervision for mobility/OOB (vs none pending progress tomorrow)     Does the patient have the potential to tolerate intense rehabilitation     Barriers to Discharge        Equipment Recommendations  None recommended by PT    Recommendations for Other Services    Frequency     Progress towards PT Goals Progress towards PT goals: Progressing toward goals  Plan Current plan remains appropriate    Precautions / Restrictions Precautions Precautions: Fall;Back Precaution Comments: BAT handout Required Braces or Orthoses: Spinal Brace Spinal Brace: Lumbar corset;Applied in sitting position   Pertinent Vitals/Pain Reports 7/10 pain, RN aware but patient cannot get anything else for pain    Mobility  Bed Mobility Bed Mobility: Left Sidelying to Sit;Rolling Left Rolling Left: 5: Supervision Left Sidelying to Sit: 5: Supervision;With rails;HOB flat Details for Bed Mobility Assistance: verbal sequencing cues, slow because of pain but moving well Transfers Transfers: Sit to Stand;Stand to Sit Sit to Stand: 5: Supervision;With upper extremity assist;From chair/3-in-1;From bed;With armrests Stand to Sit: 5: Supervision;With upper extremity assist;To chair/3-in-1;With armrests Details for Transfer Assistance: cues for safe hand placement and posture, stood much better from teh chair with arm rests Ambulation/Gait Ambulation/Gait Assistance: 4: Min guard;5: Supervision Ambulation Distance (Feet): 450  Feet Assistive device: None;Rolling walker Ambulation/Gait Assistance Details: initiated ambulation with RW however pt not really relying on it, without RW patient ambulated approx 350 ft with good stability however slightly slower pace, good ability to negotiate obstacles and turn  Gait Pattern: Step-through pattern Gait velocity: decreased Stairs: Yes Stairs Assistance: 6: Modified independent (Device/Increase time) Stair Management Technique: Two rails;Step to pattern Number of Stairs: 10      PT Goals (current goals can now be found in the care plan section)    Visit Information  Last PT Received On: 03/20/13 Assistance Needed: +1 History of Present Illness: 32 female s/p L4-5  laminectomy     Subjective Data      Cognition  Cognition Arousal/Alertness: Awake/alert Behavior During Therapy: WFL for tasks assessed/performed Overall Cognitive Status: Within Functional Limits for tasks assessed    Balance     End of Session PT - End of Session Equipment Utilized During Treatment: Gait belt Activity Tolerance: Patient tolerated treatment well;Patient limited by pain Patient left: in chair;with call bell/phone within reach Nurse Communication: Mobility status (wants a bath)   GP     WHITLOW,Autumnrose Yore HELEN 03/20/2013, 2:03 PM

## 2013-03-20 NOTE — Progress Notes (Signed)
I agree with the following treatment note after reviewing documentation.   Johnston, Shanitha Twining Brynn   OTR/L Pager: [phone] Office: [phone] .   

## 2013-03-20 NOTE — Progress Notes (Signed)
Subjective: Patient reports Offers no significant complaints moderate back pain. Ambulatory status has improved during the day  Objective: Vital signs in last 24 hours: Temp:  [97.4 F (36.3 C)-101.3 F (38.5 C)] 100.4 F (38 C) (07/24 2148) Pulse Rate:  [73-98] 90 (07/24 2148) Resp:  [16-18] 16 (07/24 2148) BP: (110-138)/(64-80) 129/64 mmHg (07/24 2148) SpO2:  [94 %-98 %] 94 % (07/24 2148)  Intake/Output from previous day: 07/23 0701 - 07/24 0700 In: -  Out: 1100 [Urine:1100] Intake/Output this shift: Total I/O In: -  Out: 850 [Urine:850]  Incision is clean and dry dressing is intact  Lab Results: No results found for this basename: WBC, HGB, HCT, PLT,  in the last 72 hours BMET No results found for this basename: NA, K, CL, CO2, GLUCOSE, BUN, CREATININE, CALCIUM,  in the last 72 hours  Studies/Results: No results found.  Assessment/Plan: Stable post   LOS: 2 days  DC IV and allow patient to shower. Probable discharge in a.m.   Rikki Smestad J 03/20/2013, 9:57 PM

## 2013-03-20 NOTE — Progress Notes (Signed)
Occupational Therapy Treatment Patient Details Name: Belinda Vazquez MRN: [id number] DOB: 1960/12/19 Today's Date: 03/20/2013 Time: 6295-2841 OT Time Calculation (min): 24 min  OT Assessment / Plan / Recommendation  History of present illness 53 female s/p L4-5  laminectomy    Clinical Impression Pt is progressing towards goals. Pt able to perform bed mobility with min vc's, and cross LE for LB dressing/bathing. Pt able to perform BUE grooming tasks standing at sink with min vc's to maintain precautions. Pt continues to benefit from skilled OT before d/c home with [facility] safety visit.      Follow Up Recommendations  Home health OT             Frequency Min 2X/week   Progress towards OT Goals Progress towards OT goals: Progressing toward goals  Plan Discharge plan remains appropriate    Precautions / Restrictions Precautions Precautions: Fall;Back Precaution Comments: Pt recalled 3/3 back precautions Required Braces or Orthoses: Spinal Brace Spinal Brace: Lumbar corset;Applied in sitting position Restrictions Weight Bearing Restrictions: No   Pertinent Vitals/Pain Pt reported 6.5/10 pain. Pt received pain medication 30 min prior to session.    ADL  Grooming: Wash/dry hands;Wash/dry face;Teeth care;Min guard Where Assessed - Grooming: Unsupported standing Upper Body Bathing: Right arm;Left arm;Chest;Min guard Where Assessed - Upper Body Bathing: Supported standing Upper Body Dressing: Set up Where Assessed - Upper Body Dressing: Supported standing Lower Body Dressing: Min guard Where Assessed - Lower Body Dressing: Unsupported sitting Tub/Shower Transfer: Min guard Tub/Shower Transfer Method: Stand pivot Equipment Used: Back brace;Gait belt Transfers/Ambulation Related to ADLs: min vc's for safe hand placement, ambulation with decr speed due to lines. ADL Comments: Pt performed BUE grooming tasks at sink, Pt used cup method for brushing teeth and taking care of dentures. Pt  educated about setting things up on the right side to prevent twisting. Pt cross LE for LB dressing. Pt with min vc's to maintain precautions during ADL.      OT Goals(current goals can now be found in the care plan section) Acute Rehab OT Goals Patient Stated Goal: home, decreased pain OT Goal Formulation: With patient Time For Goal Achievement: 04/02/13 Potential to Achieve Goals: Good ADL Goals Pt Will Perform Grooming: with modified independence;standing Pt Will Perform Lower Body Dressing: with min guard assist;with caregiver independent in assisting;sit to/from stand;with adaptive equipment Pt Will Perform Tub/Shower Transfer: with min guard assist;with caregiver independent in assisting;Stand pivot transfer Additional ADL Goal #1: Pt will recall and maintain 3/3 back precautions during ADL  Visit Information  Last OT Received On: 03/20/13 Assistance Needed: +1 History of Present Illness: 72 female s/p L4-5  laminectomy           Cognition  Cognition Arousal/Alertness: Awake/alert Behavior During Therapy: WFL for tasks assessed/performed Overall Cognitive Status: Within Functional Limits for tasks assessed    Mobility  Bed Mobility Bed Mobility: Rolling Right;Right Sidelying to Sit;Sitting - Scoot to Edge of Bed Rolling Right: 5: Supervision Rolling Left: 5: Supervision Right Sidelying to Sit: 5: Supervision;HOB flat Left Sidelying to Sit: 5: Supervision;With rails;HOB flat Sitting - Scoot to Edge of Bed: 5: Supervision Details for Bed Mobility Assistance: decr speed to to pain, and vc's to use legs to help log roll Transfers Transfers: Sit to Stand;Stand to Sit Sit to Stand: 5: Supervision;With upper extremity assist;From bed Stand to Sit: 5: Supervision;With upper extremity assist;To bed Details for Transfer Assistance: vcs for safe hand placement.          End of  Session OT - End of Session Equipment Utilized During Treatment: Gait belt;Back brace Activity  Tolerance: Patient tolerated treatment well Patient left: in bed;with call bell/phone within reach;with family/visitor present Nurse Communication: Mobility status;Other (comment);Precautions (Pt requests bathing)  GO     Sherryl Manges 03/20/2013, 3:49 PM

## 2013-03-21 MED ORDER — DIAZEPAM 5 MG PO TABS: 5.0000 mg | ORAL_TABLET | Freq: Four times a day (QID) | ORAL | Status: DC | PRN

## 2013-03-21 MED ORDER — OXYCODONE-ACETAMINOPHEN 5-325 MG PO TABS: 1.0000 | ORAL_TABLET | ORAL | Status: DC | PRN

## 2013-03-21 MED ORDER — ONDANSETRON HCL 4 MG PO TABS
4.0000 mg | ORAL_TABLET | ORAL | Status: DC | PRN
  Administered 2013-03-21: 4 mg via ORAL

## 2013-03-21 MED ORDER — ONDANSETRON 4 MG PO TBDP
ORAL_TABLET | ORAL | Status: AC
  Filled 2013-03-21: qty 1

## 2013-03-21 NOTE — Care Management Note (Signed)
    Page 1 of 1   03/21/2013     11:09:56 AM   CARE MANAGEMENT NOTE 03/21/2013  Patient:  CJ, BEECHER   Account Number:  [id number]  Date Initiated:  03/18/2013  Documentation initiated by:  Jiles Crocker  Subjective/Objective Assessment:   ADMITTED FOR SURGERY - L4-L5 decompressive laminectomy decompression of L4 and L5     Action/Plan:   LIVES WITH FAMILY MEMBERS; CM FOLLOWING FOR DCP   Anticipated DC Date:  03/25/2013   Anticipated DC Plan:  HOME/SELF CARE      DC Planning Services  CM consult      Choice offered to / List presented to:             Status of service:  Completed, signed off Medicare Important Message given?  NA - LOS <3 / Initial given by admissions (If response is "NO", the following Medicare IM given date fields will be blank) Date Medicare IM given:   Date Additional Medicare IM given:    Discharge Disposition:    Per UR Regulation:  Reviewed for med. necessity/level of care/duration of stay  If discussed at Long Length of Stay Meetings, dates discussed:    Comments:  03/21/13 1050 Elmer Bales RN, MSN, CM- Spoke with patient regarding home health.  Pt is currently declining home health services. Pt was encouraged to contact Dr Verlee Rossetti office if any further needs arise.  RN updated.   03/18/2013- B CHANDLER RN,BSN,MHA

## 2013-03-21 NOTE — Progress Notes (Signed)
Physical Therapy Treatment Patient Details Name: Belinda Vazquez MRN: [id number] DOB: 14-Oct-1960 Today's Date: 03/21/2013 Time: 6213-0865 PT Time Calculation (min): 8 min  PT Assessment / Plan / Recommendation  History of Present Illness 51 female s/p L4-5  laminectomy    PT comments Reviewed bed mobility and maintaining precautions during transitional movements. Patient very ready to d/c home.       Follow Up Recommendations  No PT follow up     Does the patient have the potential to tolerate intense rehabilitation     Barriers to Discharge        Equipment Recommendations  None recommended by PT    Recommendations for Other Services    Frequency     Progress towards PT Goals Progress towards PT goals: Progressing toward goals  Plan Discharge plan needs to be updated    Precautions / Restrictions Precautions Precautions: Back Precaution Booklet Issued: Yes (comment) Precaution Comments: Pt recalled 3/3 back precautions Required Braces or Orthoses: Spinal Brace Spinal Brace: Lumbar corset;Applied in sitting position Restrictions Weight Bearing Restrictions: No   Pertinent Vitals/Pain Reports very minimal pain 1/10    Mobility  Bed Mobility Bed Mobility: Not assessed Rolling Right: 5: Supervision Rolling Left: 5: Supervision Left Sidelying to Sit: 5: Supervision Sit to Sidelying Left: 5: Supervision Details for Bed Mobility Assistance: cues for safe sequencing, performed x2 to review precautions and safety Transfers Transfers: Not assessed Sit to Stand: 6: Modified independent (Device/Increase time);With upper extremity assist;From chair/3-in-1 Stand to Sit: 6: Modified independent (Device/Increase time);With upper extremity assist;To chair/3-in-1 Details for Transfer Assistance: pt declined      PT Goals (current goals can now be found in the care plan section) Acute Rehab PT Goals Patient Stated Goal: home, decreased pain  Visit Information  Last PT  Received On: 03/21/13 Assistance Needed: +1 History of Present Illness: 77 female s/p L4-5  laminectomy     Subjective Data  Patient Stated Goal: home, decreased pain   Cognition  Cognition Arousal/Alertness: Awake/alert Behavior During Therapy: WFL for tasks assessed/performed Overall Cognitive Status: Within Functional Limits for tasks assessed    Balance     End of Session PT - End of Session Activity Tolerance: Patient tolerated treatment well Patient left: in bed;with call bell/phone within reach   GP     [facility] HELEN 03/21/2013, 11:29 AM

## 2013-03-21 NOTE — Progress Notes (Signed)
Pt  Was given stroke information and Sx information

## 2013-03-21 NOTE — Progress Notes (Signed)
Occupational Therapy Treatment and Discharge Patient Details Name: Belinda Vazquez MRN: [id number] DOB: 1960/11/24 Today's Date: 03/21/2013 Time: 4401-0272 OT Time Calculation (min): 16 min  OT Assessment / Plan / Recommendation  History of present illness 63 female s/p L4-5  laminectomy    Clinical Impression Pt progressing and has met goals. Pt discharge plan changed to No OT follow up. Pt performed ADL at modified independent level, was able to recall/maintain back precautions, and pain is under control. Pt educated that since she is feeling better it is most important to keep the precautions since her back is still healing despite decr pain. Pt is no longer in need of OT services. OT to sign off.       Follow Up Recommendations  No OT follow up       Equipment Recommendations  None recommended by OT       Frequency Min 2X/week   Progress towards OT Goals Progress towards OT goals: Progressing toward goals;Goals met/education completed, patient discharged from OT  Plan Discharge plan needs to be updated    Precautions / Restrictions Precautions Precautions: Back Precaution Booklet Issued: Yes (comment) Precaution Comments: Pt recalled 3/3 back precautions Required Braces or Orthoses: Spinal Brace Spinal Brace: Lumbar corset;Applied in sitting position Restrictions Weight Bearing Restrictions: No   Pertinent Vitals/Pain Pt reported pain was at 1.5/10 during session.    ADL  Eating/Feeding: Independent Where Assessed - Eating/Feeding: Edge of bed Tub/Shower Transfer: Modified independent Tub/Shower Transfer Method: Ambulating Equipment Used: Back brace;Gait belt Transfers/Ambulation Related to ADLs: Pt completed transfers with no vc's necessary to maintain precautions. Pt ambulated at modified independent level. ADL Comments: Pt feeling much better so Pt educated that now is the most important time to maintain precautions because the back is still healing. Pt also educated  on smoking sessation. Pt performed tub transfer and was able to cross LE for bathing. Pt educated on maintaining precautions in the shower as review for shower that she was going to take with RN staff.      OT Goals(current goals can now be found in the care plan section) Acute Rehab OT Goals Patient Stated Goal: home, decreased pain OT Goal Formulation: With patient Time For Goal Achievement: 04/02/13 Potential to Achieve Goals: Good ADL Goals Pt Will Perform Grooming: with modified independence;standing Pt Will Perform Lower Body Dressing: with min guard assist;with caregiver independent in assisting;sit to/from stand;with adaptive equipment Pt Will Perform Tub/Shower Transfer: with min guard assist;with caregiver independent in assisting;Stand pivot transfer Additional ADL Goal #1: Pt will recall and maintain 3/3 back precautions during ADL  Visit Information  Last OT Received On: 03/21/13 Assistance Needed: +1 History of Present Illness: 10 female s/p L4-5  laminectomy           Cognition  Cognition Arousal/Alertness: Awake/alert Behavior During Therapy: WFL for tasks assessed/performed Overall Cognitive Status: Within Functional Limits for tasks assessed    Mobility  Bed Mobility Bed Mobility: Not assessed Transfers Transfers: Sit to Stand;Stand to Sit Sit to Stand: 6: Modified independent (Device/Increase time);With upper extremity assist;From chair/3-in-1 Stand to Sit: 6: Modified independent (Device/Increase time);With upper extremity assist;To chair/3-in-1          End of Session OT - End of Session Equipment Utilized During Treatment: Gait belt;Back brace Activity Tolerance: Patient tolerated treatment well Patient left: Other (comment);with nursing/sitter in room (Pt about to take shower with nursing staff) Nurse Communication: Other (comment) (about to give Pt shower)  GO     Rheanne Cortopassi,  Vernona Rieger 03/21/2013, 8:47 AM

## 2013-03-21 NOTE — Discharge Summary (Signed)
Physician Discharge Summary  Patient ID: NIKA YAZZIE MRN: [id number] DOB/AGE: 1961-07-23 52 y.o.  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 *   Discharged Condition: good  Hospital Course: Patient was admitted to undergo surgical decompression at L4-L5 secondary to severe spondylitic stenosis with left lumbar radiculopathy. She tolerated surgery well she is ambulatory incision is clean and dry and she is discharged home  Consults: None  Significant Diagnostic Studies: None  Treatments: surgery: Laminectomy decompression L4-L5 posterior interbody arthrodesis with peek spacers local autograft and allograft L4-L5 segmental fixation L4-L5 with pedicle screws  Discharge Exam: Blood pressure 114/76, pulse 80, temperature 97.6 F (36.4 C), temperature source Oral, resp. rate 18, height 5\' 2"  (1.575 m), weight 51.71 kg (114 lb), SpO2 100.00%. Incision is clean and dry motor function is intact in both lower extremities. Station and gait are normal  Disposition: Discharge home  Discharge Orders   Future Orders Complete By Expires     Call MD for:  redness, tenderness, or signs of infection (pain, swelling, redness, odor or green/yellow discharge around incision site)  As directed     Call MD for:  severe uncontrolled pain  As directed     Call MD for:  temperature >100.4  As directed     Diet - low sodium heart healthy  As directed     Discharge instructions  As directed     Comments:      Okay to shower. Do not apply salves or appointments to incision. No heavy lifting with the upper extremities greater than 15 pounds. May resume driving when not requiring pain medication and patient feels comfortable with doing so.    Increase activity slowly  As directed         Medication List         ALPRAZolam 0.25 MG tablet  Commonly known as:  XANAX  Take 0.25 mg by mouth 3 (three) times daily as needed for sleep or anxiety.     amLODipine 5 MG tablet  Commonly known as:  NORVASC  Take 5 mg by mouth daily.     diazepam 5 MG tablet  Commonly known as:  VALIUM  Take 1 tablet (5 mg total) by mouth every 6 (six) hours as needed for anxiety.     lisinopril 40 MG tablet  Commonly known as:  PRINIVIL,ZESTRIL  Take 40 mg by mouth daily.     metoprolol 50 MG tablet  Commonly known as:  LOPRESSOR  Take 50 mg by mouth every morning.     oxyCODONE-acetaminophen 5-325 MG per tablet  Commonly known as:  PERCOCET/ROXICET  Take 1-2 tablets by mouth every 4 (four) hours as needed for pain.         SignedStefani Dama 03/21/2013, 12:31 PM

## 2013-03-21 NOTE — Progress Notes (Signed)
I agree with the following treatment note after reviewing documentation.   Johnston, Supriya Beaston Brynn   OTR/L Pager: [phone] Office: [phone] .   

## 2014-06-16 ENCOUNTER — Other Ambulatory Visit: Payer: Self-pay | Admitting: Neurological Surgery

## 2014-06-16 DIAGNOSIS — M545 Low back pain: Secondary | ICD-10-CM

## 2014-06-16 DIAGNOSIS — M419 Scoliosis, unspecified: Secondary | ICD-10-CM

## 2014-06-26 ENCOUNTER — Ambulatory Visit
Admission: RE | Admit: 2014-06-26 | Discharge: 2014-06-26 | Disposition: A | Discharge status: Home / Self Care | Payer: BC Managed Care – PPO | Source: Ambulatory Visit | Attending: Neurological Surgery | Admitting: Neurological Surgery

## 2014-06-26 DIAGNOSIS — M545 Low back pain: Secondary | ICD-10-CM

## 2014-06-26 DIAGNOSIS — M419 Scoliosis, unspecified: Secondary | ICD-10-CM

## 2015-05-06 ENCOUNTER — Encounter: Payer: Self-pay | Admitting: Internal Medicine

## 2015-05-06 ENCOUNTER — Ambulatory Visit (INDEPENDENT_AMBULATORY_CARE_PROVIDER_SITE_OTHER)
Admission: RE | Admit: 2015-05-06 | Discharge: 2015-05-06 | Disposition: A | Discharge status: Home / Self Care | Payer: 59 | Source: Ambulatory Visit | Attending: Internal Medicine | Admitting: Internal Medicine

## 2015-05-06 ENCOUNTER — Ambulatory Visit (INDEPENDENT_AMBULATORY_CARE_PROVIDER_SITE_OTHER): Payer: 59 | Admitting: Internal Medicine

## 2015-05-06 VITALS — BP 120/82 | HR 60 | Ht 62.0 in | Wt 118.0 lb

## 2015-05-06 DIAGNOSIS — I10 Essential (primary) hypertension: Secondary | ICD-10-CM | POA: Diagnosis not present

## 2015-05-06 DIAGNOSIS — R0600 Dyspnea, unspecified: Secondary | ICD-10-CM | POA: Diagnosis not present

## 2015-05-06 DIAGNOSIS — F17210 Nicotine dependence, cigarettes, uncomplicated: Secondary | ICD-10-CM | POA: Insufficient documentation

## 2015-05-06 DIAGNOSIS — Z720 Tobacco use: Secondary | ICD-10-CM | POA: Diagnosis not present

## 2015-05-06 MED ORDER — AMLODIPINE-OLMESARTAN 5-40 MG PO TABS: 1.0000 | ORAL_TABLET | Freq: Every day | ORAL | Status: AC

## 2015-05-06 NOTE — Patient Instructions (Addendum)
Stop lisinopril and amlodipine and start azor 5/40 one daily and your breathing should improve   Check your blood pressure several times a week  if you can to be sure this new dose is working well   The key is to stop smoking completely before smoking completely stops you!  Please schedule a follow up office visit in 4 weeks, call sooner if needed

## 2015-05-06 NOTE — Assessment & Plan Note (Signed)
ACE inhibitors are problematic in  pts with airway complaints because  even experienced pulmonologists can't always distinguish ace effects from copd/asthma.  By themselves they don't actually cause a problem, much like oxygen can't by itself start a fire, but they certainly serve as a powerful catalyst or enhancer for any "fire"  or inflammatory process in the upper airway, be it caused by an ET  tube or more commonly reflux (especially in the obese or pts with known GERD or who are on biphoshonates).    In the era of ARB near equivalency until we have a better handle on the reversibility of the airway problem, it just makes sense to avoid ACEI  entirely in the short run and then decide later, having established a level of airway control using a reasonable limited regimen, whether to add back ace but even then being very careful to observe the pt for worsening airway control and number of meds used/ needed to control symptoms.    Try azor 40/5 one daily and regroup in 4 weeks

## 2015-05-06 NOTE — Assessment & Plan Note (Signed)
-   spirometry 05/06/2015 wnl including fef 25/75   Symptoms are markedly disproportionate to objective findings and not clear this is a lung problem but pt does appear to have difficult airway management issues. DDX of  difficult airways management all start with A and  include Adherence, Ace Inhibitors, Acid Reflux, Active Sinus Disease, Alpha 1 Antitripsin deficiency, Anxiety masquerading as Airways dz,  ABPA,  allergy(esp in young), Aspiration (esp in elderly), Adverse effects of meds,  Active smokers, A bunch of PE's (a small clot burden can't cause this syndrome unless there is already severe underlying pulm or vascular dz with poor reserve) plus two Bs  = Bronchiectasis and Beta blocker use..and one C= CHF  ACEi top of list of suspects > needs trial off > see hbp  Active smoking > see sep a/p  I had an extended discussion with the patient reviewing all relevant studies completed to date and  lasting 35 m    Each maintenance medication was reviewed in detail including most importantly the difference between maintenance and prns and under what circumstances the prns are to be triggered using an action plan format that is not reflected in the computer generated alphabetically organized AVS.    Please see instructions for details which were reviewed in writing and the patient given a copy highlighting the part that I personally wrote and discussed at today's ov.

## 2015-05-06 NOTE — Progress Notes (Addendum)
   Subjective:    [name], female    DOB: June 23, 1961,    MRN: [id number]  HPI  44 yowf still actively smoking with new indolent  onset progressive doe first noted spring 2016 referred to pulmonary clinic 05/06/2015 by Dr Ellin Goodie for preop pulmonary eval prior to scoliosis surgery with nl spirometry at first eval.    05/06/2015 1st [facility] office visit/ Wert   Chief Complaint  Patient presents with  . Pulmonary Consult    Referred by Dr. Barnett Abu.  Pt c/o SOB and 3 yrs- worse for the past 6-8 months.  She states that she gets SOB walking to her mailbox and back.  She also wakes up in the night feeling as if she is smothering.     mailbox x 50 ft slt uphill to house and has to stop there/ assoc with dry day > noct cough/ does not have saba   No obvious other patterns in day to day or daytime variabilty or assoc chronic cough or cp or chest tightness, subjective wheeze or  overt sinus  symptoms. No unusual exp hx or h/o childhood pna/ asthma or knowledge of premature birth.   Also denies any obvious fluctuation of symptoms with weather or environmental changes or other aggravating or alleviating factors except as outlined above   Current Medications, Allergies, Complete Past Medical History, Past Surgical History, Family History, and Social History were reviewed in [employer] record.             Review of Systems  Constitutional: Negative for fever, chills and unexpected weight change.  HENT: Positive for congestion. Negative for dental problem, ear pain, nosebleeds, postnasal drip, rhinorrhea, sinus pressure, sneezing, sore throat, trouble swallowing and voice change.   Eyes: Negative for visual disturbance.  Respiratory: Positive for cough and shortness of breath. Negative for choking.   Cardiovascular: Negative for chest pain and leg swelling.  Gastrointestinal: Negative for vomiting, abdominal pain and diarrhea.  Genitourinary:  Negative for difficulty urinating.       Acid heartburn Indigestion  Musculoskeletal: Negative for arthralgias.  Skin: Negative for rash.  Neurological: Positive for headaches. Negative for tremors and syncope.  Hematological: Does not bruise/bleed easily.       Objective:   Physical Exam  Top dentures  Wt Readings from Last 3 Encounters:  05/06/15 118 lb (53.524 kg)  03/19/13 114 lb (51.71 kg)  03/10/13 114 lb 11.2 oz (52.028 kg)    Vital signs reviewed  HEENT: nl dentition, turbinates, and orophanx. Nl external ear canals without cough reflex   NECK :  without JVD/Nodes/TM/ nl carotid upstrokes bilaterally   LUNGS: no acc muscle use, clear to A and P bilaterally without cough on insp or exp maneuvers   CV:  RRR  no s3 or murmur or increase in P2, no edema   ABD:  soft and nontender with nl excursion in the supine position. No bruits or organomegaly, bowel sounds nl  MS:  warm without deformities, calf tenderness, cyanosis or clubbing  SKIN: warm and dry without lesions    NEURO:  alert, approp, no deficits    CXR PA and Lateral:   05/06/2015 :     I personally reviewed images and agree with radiology impression as follows:  no acute abnormalities         Assessment & Plan:

## 2015-05-06 NOTE — Assessment & Plan Note (Signed)

## 2015-05-07 NOTE — Progress Notes (Signed)
Quick Note:  Spoke with the pt's mother and notified of results/recs  She verbalized understanding  Nothing further needed ______

## 2015-06-03 ENCOUNTER — Ambulatory Visit: Payer: 59 | Admitting: Internal Medicine

## 2015-12-02 IMAGING — CR DG CHEST 2V
2 series · 2 of 2 positions shown · non-contrast
Comparison: March 10, 2013

CLINICAL DATA: Two-month history of shortness of breath with
exertion. Hypertension.

EXAM:
CHEST  2 VIEW

[view not recorded (1 of 2)]
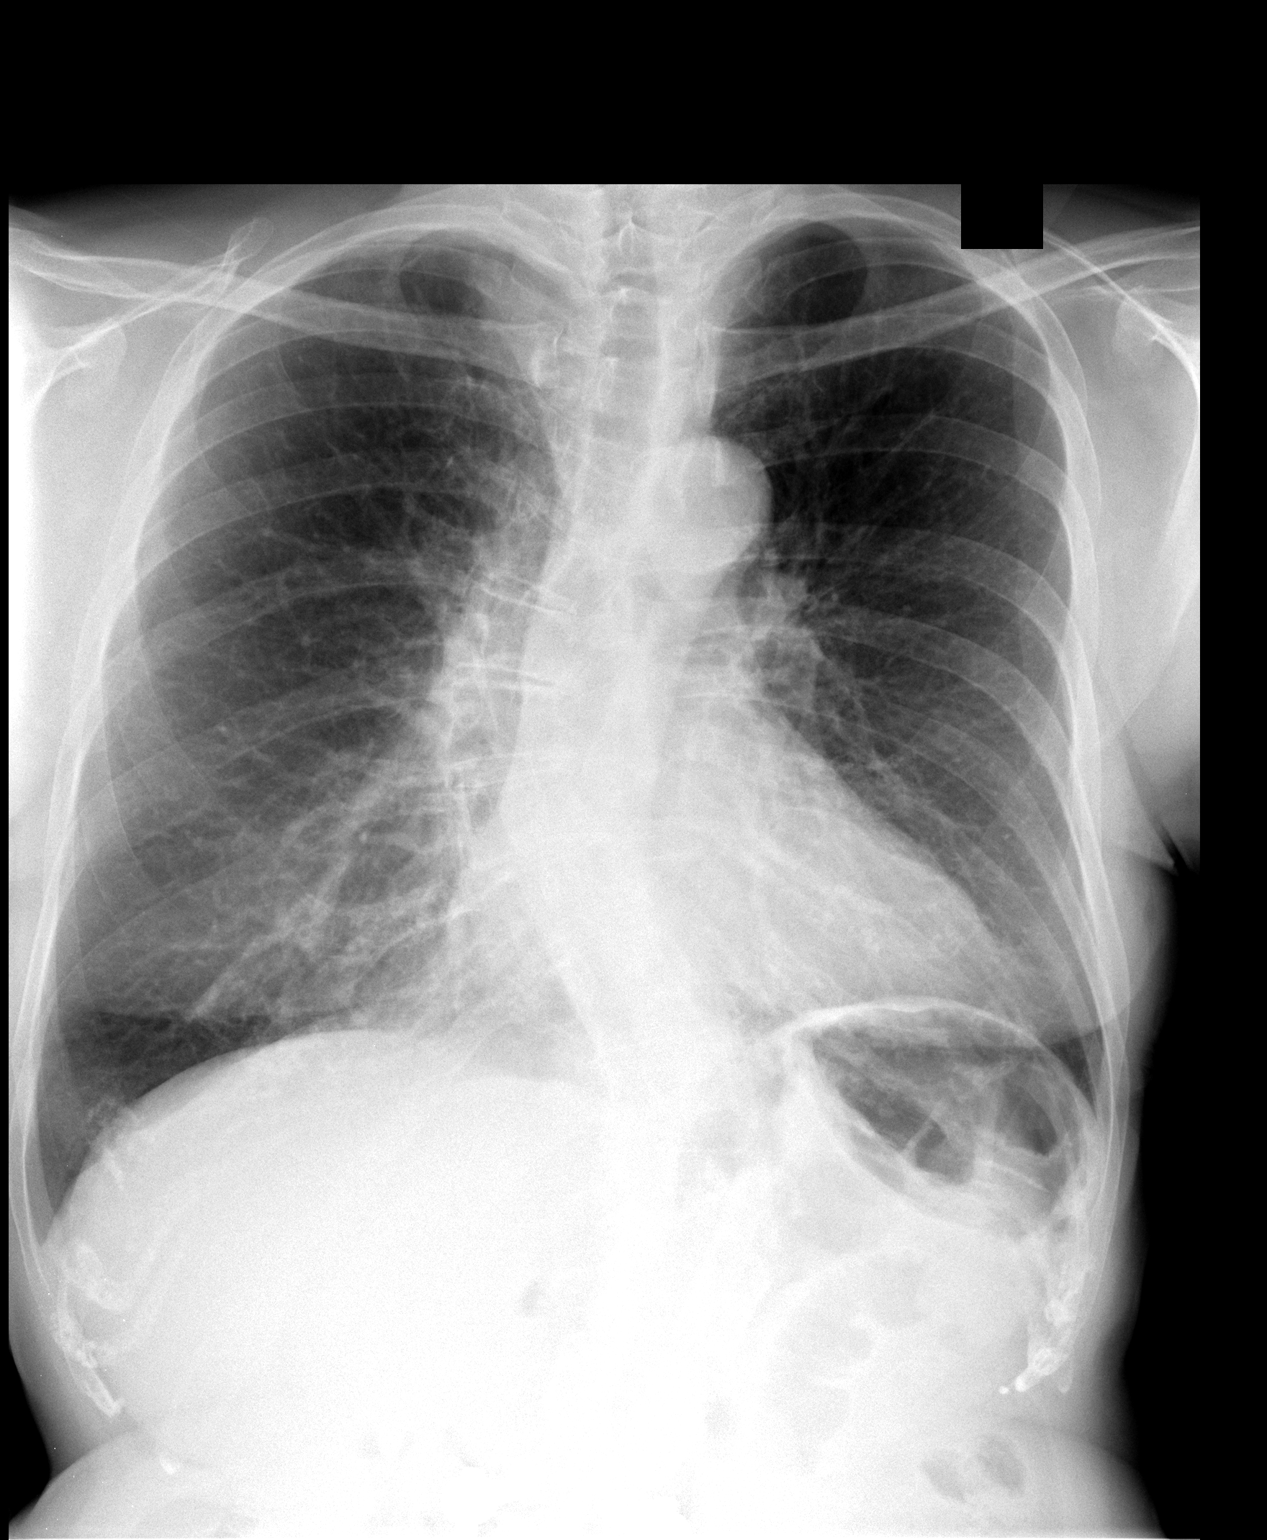

[view not recorded (2 of 2)]
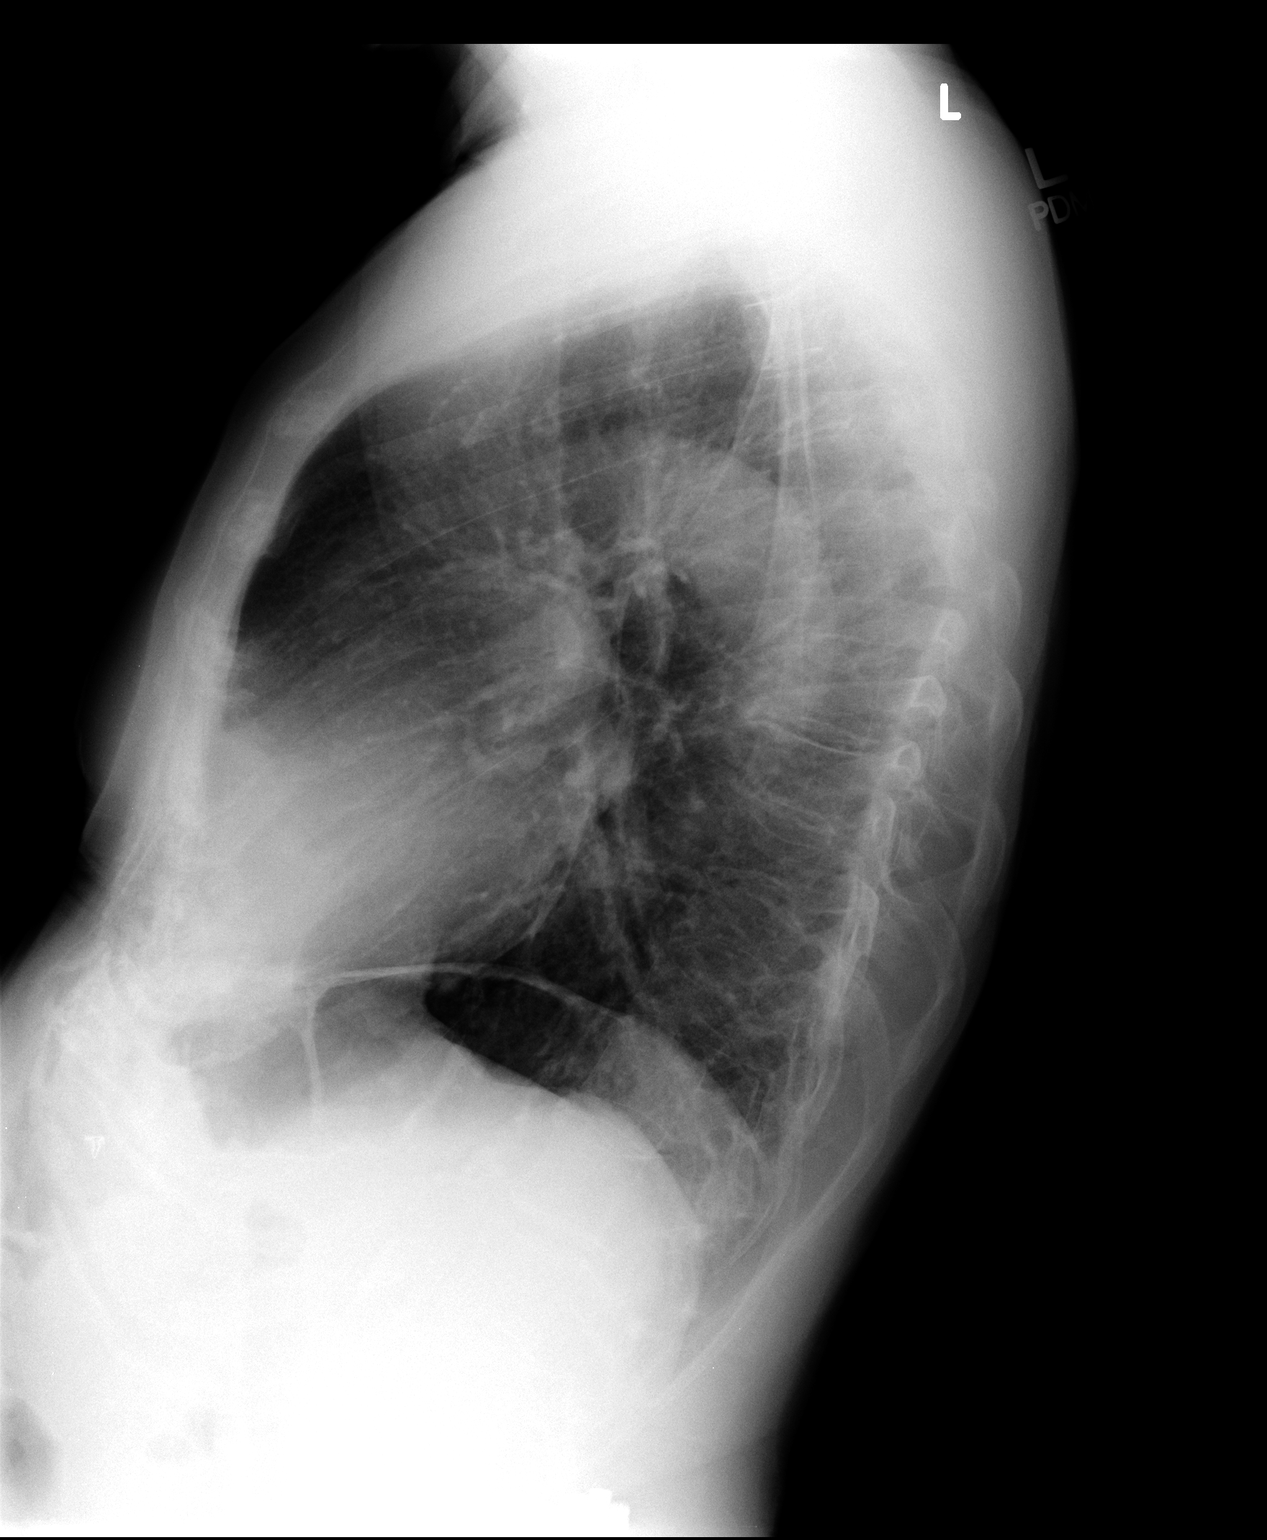

[2 of 2 positions shown; findings below may reference images not displayed]

FINDINGS: There is no edema or consolidation. Heart size and pulmonary
vascularity are within normal limits. No adenopathy. There is mid
thoracic dextroscoliosis with thoracolumbar levoscoliosis.
IMPRESSION: Stable scoliosis.  No lung edema or consolidation.
